# Patient Record
Sex: Female | Born: 1945 | Race: White | Hispanic: No | Smoking: Former smoker
Health system: Southern US, Community
[De-identification: ages and names within clinical notes are randomized; demographics above are authoritative.]

## PROBLEM LIST (undated history)

## (undated) DIAGNOSIS — E039 Hypothyroidism, unspecified: Secondary | ICD-10-CM

## (undated) DIAGNOSIS — J449 Chronic obstructive pulmonary disease, unspecified: Secondary | ICD-10-CM

## (undated) DIAGNOSIS — I10 Essential (primary) hypertension: Secondary | ICD-10-CM

## (undated) DIAGNOSIS — N289 Disorder of kidney and ureter, unspecified: Secondary | ICD-10-CM

## (undated) DIAGNOSIS — C801 Malignant (primary) neoplasm, unspecified: Secondary | ICD-10-CM

## (undated) HISTORY — PX: BACK SURGERY: SHX140

## (undated) HISTORY — PX: HERNIA REPAIR: SHX51

## (undated) HISTORY — PX: TONSILLECTOMY: SUR1361

## (undated) HISTORY — PX: APPENDECTOMY: SHX54

## (undated) HISTORY — PX: OTHER SURGICAL HISTORY: SHX169

## (undated) HISTORY — PX: TUBAL LIGATION: SHX77

---

## 2018-01-18 ENCOUNTER — Inpatient Hospital Stay (HOSPITAL_BASED_OUTPATIENT_CLINIC_OR_DEPARTMENT_OTHER)
Admission: EM | Admit: 2018-01-18 | Discharge: 2018-01-21 | DRG: 871 | Disposition: A | Discharge status: Home / Self Care | Payer: Medicare Other | Attending: Internal Medicine | Admitting: Internal Medicine

## 2018-01-18 ENCOUNTER — Other Ambulatory Visit: Payer: Self-pay

## 2018-01-18 ENCOUNTER — Emergency Department (HOSPITAL_BASED_OUTPATIENT_CLINIC_OR_DEPARTMENT_OTHER): Payer: Medicare Other

## 2018-01-18 ENCOUNTER — Encounter (HOSPITAL_BASED_OUTPATIENT_CLINIC_OR_DEPARTMENT_OTHER): Payer: Self-pay | Admitting: *Deleted

## 2018-01-18 DIAGNOSIS — J189 Pneumonia, unspecified organism: Secondary | ICD-10-CM | POA: Diagnosis not present

## 2018-01-18 DIAGNOSIS — I10 Essential (primary) hypertension: Secondary | ICD-10-CM | POA: Diagnosis not present

## 2018-01-18 DIAGNOSIS — Z888 Allergy status to other drugs, medicaments and biological substances status: Secondary | ICD-10-CM | POA: Diagnosis not present

## 2018-01-18 DIAGNOSIS — A419 Sepsis, unspecified organism: Secondary | ICD-10-CM | POA: Diagnosis present

## 2018-01-18 DIAGNOSIS — E039 Hypothyroidism, unspecified: Secondary | ICD-10-CM | POA: Diagnosis present

## 2018-01-18 DIAGNOSIS — E785 Hyperlipidemia, unspecified: Secondary | ICD-10-CM | POA: Diagnosis not present

## 2018-01-18 DIAGNOSIS — Z7982 Long term (current) use of aspirin: Secondary | ICD-10-CM

## 2018-01-18 DIAGNOSIS — Z79899 Other long term (current) drug therapy: Secondary | ICD-10-CM | POA: Diagnosis not present

## 2018-01-18 DIAGNOSIS — Z905 Acquired absence of kidney: Secondary | ICD-10-CM | POA: Diagnosis not present

## 2018-01-18 DIAGNOSIS — J441 Chronic obstructive pulmonary disease with (acute) exacerbation: Secondary | ICD-10-CM | POA: Diagnosis not present

## 2018-01-18 DIAGNOSIS — J440 Chronic obstructive pulmonary disease with acute lower respiratory infection: Secondary | ICD-10-CM | POA: Diagnosis not present

## 2018-01-18 DIAGNOSIS — Z881 Allergy status to other antibiotic agents status: Secondary | ICD-10-CM | POA: Diagnosis not present

## 2018-01-18 DIAGNOSIS — R059 Cough, unspecified: Secondary | ICD-10-CM

## 2018-01-18 DIAGNOSIS — R05 Cough: Secondary | ICD-10-CM

## 2018-01-18 DIAGNOSIS — Z85528 Personal history of other malignant neoplasm of kidney: Secondary | ICD-10-CM

## 2018-01-18 DIAGNOSIS — R6510 Systemic inflammatory response syndrome (SIRS) of non-infectious origin without acute organ dysfunction: Secondary | ICD-10-CM | POA: Diagnosis present

## 2018-01-18 DIAGNOSIS — J9601 Acute respiratory failure with hypoxia: Secondary | ICD-10-CM | POA: Diagnosis present

## 2018-01-18 DIAGNOSIS — Z87891 Personal history of nicotine dependence: Secondary | ICD-10-CM | POA: Diagnosis not present

## 2018-01-18 HISTORY — DX: Chronic obstructive pulmonary disease, unspecified: J44.9

## 2018-01-18 HISTORY — DX: Hypothyroidism, unspecified: E03.9

## 2018-01-18 HISTORY — DX: Essential (primary) hypertension: I10

## 2018-01-18 HISTORY — DX: Disorder of kidney and ureter, unspecified: N28.9

## 2018-01-18 HISTORY — DX: Malignant (primary) neoplasm, unspecified: C80.1

## 2018-01-18 LAB — CBC WITH DIFFERENTIAL/PLATELET
Abs Immature Granulocytes: 0.12 10*3/uL — ABNORMAL HIGH (ref 0.00–0.07)
Basophils Absolute: 0.1 10*3/uL (ref 0.0–0.1)
Basophils Relative: 0 %
Eosinophils Absolute: 0.1 10*3/uL (ref 0.0–0.5)
Eosinophils Relative: 1 %
HCT: 42.9 % (ref 36.0–46.0)
Hemoglobin: 13.2 g/dL (ref 12.0–15.0)
Immature Granulocytes: 1 %
Lymphocytes Relative: 5 %
Lymphs Abs: 1 10*3/uL (ref 0.7–4.0)
MCH: 24.9 pg — ABNORMAL LOW (ref 26.0–34.0)
MCHC: 30.8 g/dL (ref 30.0–36.0)
MCV: 80.9 fL (ref 80.0–100.0)
Monocytes Absolute: 1.3 10*3/uL — ABNORMAL HIGH (ref 0.1–1.0)
Monocytes Relative: 7 %
NEUTROS PCT: 86 %
Neutro Abs: 15.6 10*3/uL — ABNORMAL HIGH (ref 1.7–7.7)
Platelets: 278 10*3/uL (ref 150–400)
RBC: 5.3 MIL/uL — ABNORMAL HIGH (ref 3.87–5.11)
RDW: 13.9 % (ref 11.5–15.5)
WBC: 18.2 10*3/uL — ABNORMAL HIGH (ref 4.0–10.5)
nRBC: 0 % (ref 0.0–0.2)

## 2018-01-18 LAB — COMPREHENSIVE METABOLIC PANEL
ALBUMIN: 3.8 g/dL (ref 3.5–5.0)
ALT: 25 U/L (ref 0–44)
AST: 26 U/L (ref 15–41)
Alkaline Phosphatase: 65 U/L (ref 38–126)
Anion gap: 9 (ref 5–15)
BUN: 18 mg/dL (ref 8–23)
CO2: 29 mmol/L (ref 22–32)
Calcium: 9.9 mg/dL (ref 8.9–10.3)
Chloride: 99 mmol/L (ref 98–111)
Creatinine, Ser: 1.03 mg/dL — ABNORMAL HIGH (ref 0.44–1.00)
GFR calc Af Amer: >60 mL/min (ref >60)
GFR calc non Af Amer: 55 mL/min — ABNORMAL LOW (ref >60)
Glucose, Bld: 158 mg/dL — ABNORMAL HIGH (ref 70–99)
Potassium: 4.4 mmol/L (ref 3.5–5.1)
Sodium: 137 mmol/L (ref 135–145)
Total Bilirubin: 0.4 mg/dL (ref 0.3–1.2)
Total Protein: 7.1 g/dL (ref 6.5–8.1)

## 2018-01-18 LAB — URINALYSIS, MICROSCOPIC (REFLEX)

## 2018-01-18 LAB — URINALYSIS, ROUTINE W REFLEX MICROSCOPIC
Bilirubin Urine: NEGATIVE
Glucose, UA: NEGATIVE mg/dL
Ketones, ur: NEGATIVE mg/dL
Leukocytes, UA: NEGATIVE
Nitrite: NEGATIVE
Protein, ur: NEGATIVE mg/dL
Specific Gravity, Urine: 1.02 (ref 1.005–1.030)
pH: 8 (ref 5.0–8.0)

## 2018-01-18 LAB — PROTIME-INR
INR: 0.97
Prothrombin Time: 12.8 seconds (ref 11.4–15.2)

## 2018-01-18 LAB — I-STAT CG4 LACTIC ACID, ED: Lactic Acid, Venous: 2 mmol/L (ref 0.5–1.9)

## 2018-01-18 MED ORDER — SODIUM CHLORIDE 0.9 % IV BOLUS
500.0000 mL | Freq: Once | INTRAVENOUS | Status: AC
  Administered 2018-01-18: 500 mL via INTRAVENOUS

## 2018-01-18 MED ORDER — ACETAMINOPHEN 325 MG PO TABS
650.0000 mg | ORAL_TABLET | Freq: Once | ORAL | Status: AC
  Administered 2018-01-18: 650 mg via ORAL
  Filled 2018-01-18: qty 2

## 2018-01-18 MED ORDER — SODIUM CHLORIDE 0.9 % IV SOLN
1.0000 g | Freq: Once | INTRAVENOUS | Status: AC
  Administered 2018-01-18: 1 g via INTRAVENOUS
  Filled 2018-01-18: qty 10

## 2018-01-18 MED ORDER — SODIUM CHLORIDE 0.9 % IV SOLN
INTRAVENOUS | Status: DC | PRN
  Administered 2018-01-18: 500 mL via INTRAVENOUS

## 2018-01-18 MED ORDER — ALBUTEROL SULFATE (2.5 MG/3ML) 0.083% IN NEBU
5.0000 mg | INHALATION_SOLUTION | Freq: Once | RESPIRATORY_TRACT | Status: AC
  Administered 2018-01-18: 5 mg via RESPIRATORY_TRACT
  Filled 2018-01-18: qty 6

## 2018-01-18 NOTE — Progress Notes (Signed)
Patient's lactic acid is 2.00 mmol/L.  Dr Ralene Bathe notified of results.

## 2018-01-18 NOTE — ED Notes (Signed)
EDP came out of pt's room and stated that the patient was taken her own ibuprofen 400 mg at this time.

## 2018-01-18 NOTE — ED Notes (Signed)
Pt. Reports she came to ED due to fever chills and breathing being off and back pain.

## 2018-01-18 NOTE — ED Provider Notes (Signed)
[facility] EMERGENCY DEPARTMENT Provider Note   CSN: [id number] Arrival date & time: 01/18/18  2006     History   Chief Complaint Chief Complaint  Patient presents with  . Fever  . Shortness of Breath    HPI Rhonda Hancock is a 72 y.o. female.  The history is provided by the patient. No language interpreter was used.  Fever    Shortness of Breath  Associated symptoms include a fever.   Rhonda Hancock is a 72 y.o. female who presents to the Emergency Department complaining of fever. Presents to the emergency department complaining of fever and chills that began about 4 o'clock this afternoon. She was feeling well prior to the symptoms beginning. She then developed nausea and shaking chills. Later in the evening she developed fevers and she presents for evaluation. She does feel slightly short of breath without any significant cough. No abdominal pain. She has experienced urinary frequency over the last few days. No dysuria. She has a history of COPD as well as partial nephrectomy for renal cell carcinoma. She is currently visiting from [place]. Past Medical History:  Diagnosis Date  . Cancer ([place])   . COPD (chronic obstructive pulmonary disease) ([place])   . Hypertension   . Renal disorder     Patient Active Problem List   Diagnosis Date Noted  . SIRS (systemic inflammatory response syndrome) ([place]) 01/18/2018    Past Surgical History:  Procedure Laterality Date  . BACK SURGERY    . HERNIA REPAIR    . kidney cancer    . TONSILLECTOMY    . TUBAL LIGATION       OB History   None      Home Medications    Prior to Admission medications   Medication Sig Start Date End Date Taking? Authorizing Provider  Levothyroxine Sodium (SYNTHROID PO) Take by mouth.   Yes [provider]  metoprolol succinate (TOPROL-XL) 25 MG 24 hr tablet Take 25 mg by mouth daily.   Yes [provider]  omeprazole (PRILOSEC) 40 MG capsule Take 40 mg by mouth daily.    Yes [provider]  SIMVASTATIN PO Take by mouth.   Yes [provider]  Umeclidinium-Vilanterol (ANORO ELLIPTA IN) Inhale into the lungs.   Yes [provider]    Family History No family history on file.  Social History Social History   Tobacco Use  . Smoking status: Former Research scientist (life sciences)  . Smokeless tobacco: Never Used  Substance Use Topics  . Alcohol use: Yes  . Drug use: Never     Allergies   Vibramycin [doxycycline calcium]   Review of Systems Review of Systems  Constitutional: Positive for fever.  Respiratory: Positive for shortness of breath.   All other systems reviewed and are negative.    Physical Exam Updated Vital Signs BP (!) 120/54   Pulse (!) 121   Temp (!) 103.1 F (39.5 C) (Oral)   Resp 19   Ht 5' (1.524 m)   Wt 74.8 kg   SpO2 94%   BMI 32.22 kg/m   Physical Exam  Constitutional: She is oriented to person, place, and time. She appears well-developed and well-nourished.  HENT:  Head: Normocephalic and atraumatic.  Cardiovascular: Regular rhythm.  Tachycardic. Systolic ejection murmur.  Pulmonary/Chest: No respiratory distress.  Tachypnea. Decreased air movement bilaterally  Abdominal: Soft. There is no tenderness. There is no rebound and no guarding.  Musculoskeletal: She exhibits no tenderness.  Trace edema to BLE  Neurological:  She is alert and oriented to person, place, and time.  Skin: Skin is warm and dry.  Psychiatric: She has a normal mood and affect. Her behavior is normal.  Nursing note and vitals reviewed.    ED Treatments / Results  Labs (all labs ordered are listed, but only abnormal results are displayed) Labs Reviewed  COMPREHENSIVE METABOLIC PANEL - Abnormal; Notable for the following components:      Result Value   Glucose, Bld 158 (*)    Creatinine, Ser 1.03 (*)    GFR calc non Af Amer 55 (*)    All other components within normal limits  CBC WITH DIFFERENTIAL/PLATELET - Abnormal; Notable  for the following components:   WBC 18.2 (*)    RBC 5.30 (*)    MCH 24.9 (*)    Neutro Abs 15.6 (*)    Monocytes Absolute 1.3 (*)    Abs Immature Granulocytes 0.12 (*)    All other components within normal limits  URINALYSIS, ROUTINE W REFLEX MICROSCOPIC - Abnormal; Notable for the following components:   Hgb urine dipstick TRACE (*)    All other components within normal limits  URINALYSIS, MICROSCOPIC (REFLEX) - Abnormal; Notable for the following components:   Bacteria, UA FEW (*)    All other components within normal limits  I-STAT CG4 LACTIC ACID, ED - Abnormal; Notable for the following components:   Lactic Acid, Venous 2.00 (*)    All other components within normal limits  CULTURE, BLOOD (ROUTINE X 2)  CULTURE, BLOOD (ROUTINE X 2)  PROTIME-INR  I-STAT CG4 LACTIC ACID, ED    EKG None  Radiology Dg Chest 2 View  Result Date: 01/18/2018 CLINICAL DATA:  Fever and chills with dyspnea since 4 p.m. EXAM: CHEST - 2 VIEW COMPARISON:  None. FINDINGS: Top-normal heart size with mild-to-moderate aortic atherosclerosis. Loop recording device projects over the left anterior chest wall. Slight hyperinflation of the lungs, upper lobe predominant with crowding of lower lobe interstitial lung markings. No alveolar consolidation, effusion or pneumothorax. 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 Aortic atherosclerosis. No active pulmonary disease. Electronically Signed   By: Ashley Royalty M.D.   On: 01/18/2018 21:27    Procedures Procedures (including critical care time)  Medications Ordered in ED Medications  cefTRIAXone (ROCEPHIN) 1 g in sodium chloride 0.9 % 100 mL IVPB (1 g Intravenous New Bag/Given 01/18/18 2333)  0.9 %  sodium chloride infusion (500 mLs Intravenous New Bag/Given 01/18/18 2330)  acetaminophen (TYLENOL) tablet 650 mg (650 mg Oral Given 01/18/18 2045)  sodium chloride 0.9 % bolus  500 mL (0 mLs Intravenous Stopped 01/18/18 2233)  albuterol (PROVENTIL) (2.5 MG/3ML) 0.083% nebulizer solution 5 mg (5 mg Nebulization Given 01/18/18 2134)     Initial Impression / Assessment and Plan / ED Course  I have reviewed the triage vital signs and the nursing notes.  Pertinent labs & imaging results that were available during my care of the patient were reviewed by me and considered in my medical decision making (see chart for details).     Here for evaluation of sudden onset fever, chills, shortness of breath with several days of urinary frequency. She is ill appearing on evaluation with tachypnea, decreased air movement. She is treated with IV fluids, butyl neb. There is improvement in her air movement following albuterol treatment. Chest x-ray without evidence of pneumonia. Antibiotics ordered for possible UTI pending urinalysis. UA is not consistent with UTI. Patient  initially declined antibiotics due to probiotics not being available in this facility. Discussed with patient concern for possible bacterial infection and recommendation for antibiotics. Patient eventually agreeable to antibiotics. Also possibility for influenza. Flu testing not available at this time. Given new oxygen requirement, search criteria recommend admission to the hospital. Plan to transfer to [facility] for further treatment and additional testing. Discussed with patient findings of studies and recommendation for admission and she is in agreement with treatment plan. Hospitalist consulted for admission.  Final Clinical Impressions(s) / ED Diagnoses   Final diagnoses:  None    ED Discharge Orders    None       Quintella Reichert, MD 01/18/18 2338

## 2018-01-18 NOTE — ED Triage Notes (Addendum)
Fever, chills, SOB today. Back pain. Urinary frequency. She took Advil at 4pm.

## 2018-01-18 NOTE — ED Notes (Signed)
She was placed on oxygen at triage while tx room is being cleaned.

## 2018-01-18 NOTE — ED Notes (Signed)
Family at bedside. 

## 2018-01-18 NOTE — Progress Notes (Signed)
Placed patient on 3 liter nasal cannula to patient's SPO2 of 86 and WOB.  Patient's SPO2 increased to 95% and WOB has decreased.

## 2018-01-19 ENCOUNTER — Encounter (HOSPITAL_COMMUNITY): Payer: Self-pay | Admitting: Internal Medicine

## 2018-01-19 DIAGNOSIS — Z79899 Other long term (current) drug therapy: Secondary | ICD-10-CM | POA: Diagnosis not present

## 2018-01-19 DIAGNOSIS — Z87891 Personal history of nicotine dependence: Secondary | ICD-10-CM | POA: Diagnosis not present

## 2018-01-19 DIAGNOSIS — A419 Sepsis, unspecified organism: Secondary | ICD-10-CM | POA: Diagnosis present

## 2018-01-19 DIAGNOSIS — E039 Hypothyroidism, unspecified: Secondary | ICD-10-CM | POA: Diagnosis present

## 2018-01-19 DIAGNOSIS — I10 Essential (primary) hypertension: Secondary | ICD-10-CM | POA: Diagnosis present

## 2018-01-19 DIAGNOSIS — J189 Pneumonia, unspecified organism: Secondary | ICD-10-CM | POA: Diagnosis present

## 2018-01-19 DIAGNOSIS — E785 Hyperlipidemia, unspecified: Secondary | ICD-10-CM | POA: Diagnosis present

## 2018-01-19 DIAGNOSIS — Z881 Allergy status to other antibiotic agents status: Secondary | ICD-10-CM | POA: Diagnosis not present

## 2018-01-19 DIAGNOSIS — J9601 Acute respiratory failure with hypoxia: Secondary | ICD-10-CM | POA: Diagnosis present

## 2018-01-19 DIAGNOSIS — Z905 Acquired absence of kidney: Secondary | ICD-10-CM | POA: Diagnosis not present

## 2018-01-19 DIAGNOSIS — J441 Chronic obstructive pulmonary disease with (acute) exacerbation: Secondary | ICD-10-CM | POA: Diagnosis present

## 2018-01-19 DIAGNOSIS — Z888 Allergy status to other drugs, medicaments and biological substances status: Secondary | ICD-10-CM | POA: Diagnosis not present

## 2018-01-19 DIAGNOSIS — R6510 Systemic inflammatory response syndrome (SIRS) of non-infectious origin without acute organ dysfunction: Secondary | ICD-10-CM | POA: Diagnosis not present

## 2018-01-19 DIAGNOSIS — Z85528 Personal history of other malignant neoplasm of kidney: Secondary | ICD-10-CM | POA: Diagnosis not present

## 2018-01-19 DIAGNOSIS — Z7982 Long term (current) use of aspirin: Secondary | ICD-10-CM | POA: Diagnosis not present

## 2018-01-19 DIAGNOSIS — J440 Chronic obstructive pulmonary disease with acute lower respiratory infection: Secondary | ICD-10-CM | POA: Diagnosis present

## 2018-01-19 LAB — CBC WITH DIFFERENTIAL/PLATELET
Abs Immature Granulocytes: 0.3 10*3/uL — ABNORMAL HIGH (ref 0.00–0.07)
Basophils Absolute: 0.1 10*3/uL (ref 0.0–0.1)
Basophils Relative: 0 %
Eosinophils Absolute: 0 10*3/uL (ref 0.0–0.5)
Eosinophils Relative: 0 %
HCT: 39.5 % (ref 36.0–46.0)
HEMOGLOBIN: 12.1 g/dL (ref 12.0–15.0)
Immature Granulocytes: 1 %
Lymphocytes Relative: 5 %
Lymphs Abs: 1.2 10*3/uL (ref 0.7–4.0)
MCH: 25.5 pg — AB (ref 26.0–34.0)
MCHC: 30.6 g/dL (ref 30.0–36.0)
MCV: 83.2 fL (ref 80.0–100.0)
Monocytes Absolute: 2.2 10*3/uL — ABNORMAL HIGH (ref 0.1–1.0)
Monocytes Relative: 9 %
Neutro Abs: 20 10*3/uL — ABNORMAL HIGH (ref 1.7–7.7)
Neutrophils Relative %: 85 %
Platelets: 236 10*3/uL (ref 150–400)
RBC: 4.75 MIL/uL (ref 3.87–5.11)
RDW: 14.2 % (ref 11.5–15.5)
WBC: 23.8 10*3/uL — ABNORMAL HIGH (ref 4.0–10.5)
nRBC: 0 % (ref 0.0–0.2)

## 2018-01-19 LAB — INFLUENZA PANEL BY PCR (TYPE A & B)
Influenza A By PCR: NEGATIVE
Influenza B By PCR: NEGATIVE

## 2018-01-19 LAB — HEPATIC FUNCTION PANEL
ALT: 23 U/L (ref 0–44)
AST: 27 U/L (ref 15–41)
Albumin: 3.5 g/dL (ref 3.5–5.0)
Alkaline Phosphatase: 51 U/L (ref 38–126)
BILIRUBIN DIRECT: 0.1 mg/dL (ref 0.0–0.2)
Indirect Bilirubin: 0.5 mg/dL (ref 0.3–0.9)
Total Bilirubin: 0.6 mg/dL (ref 0.3–1.2)
Total Protein: 6.2 g/dL — ABNORMAL LOW (ref 6.5–8.1)

## 2018-01-19 LAB — RESPIRATORY PANEL BY PCR
ADENOVIRUS-RVPPCR: NOT DETECTED
Bordetella pertussis: NOT DETECTED
CHLAMYDOPHILA PNEUMONIAE-RVPPCR: NOT DETECTED
CORONAVIRUS NL63-RVPPCR: NOT DETECTED
Coronavirus 229E: NOT DETECTED
Coronavirus HKU1: NOT DETECTED
Coronavirus OC43: NOT DETECTED
Influenza A: NOT DETECTED
Influenza B: NOT DETECTED
Metapneumovirus: NOT DETECTED
Mycoplasma pneumoniae: NOT DETECTED
Parainfluenza Virus 1: NOT DETECTED
Parainfluenza Virus 2: NOT DETECTED
Parainfluenza Virus 3: NOT DETECTED
Parainfluenza Virus 4: NOT DETECTED
Respiratory Syncytial Virus: NOT DETECTED
Rhinovirus / Enterovirus: NOT DETECTED

## 2018-01-19 LAB — BASIC METABOLIC PANEL
Anion gap: 9 (ref 5–15)
BUN: 19 mg/dL (ref 8–23)
CO2: 26 mmol/L (ref 22–32)
Calcium: 9.1 mg/dL (ref 8.9–10.3)
Chloride: 105 mmol/L (ref 98–111)
Creatinine, Ser: 1.03 mg/dL — ABNORMAL HIGH (ref 0.44–1.00)
GFR calc Af Amer: >60 mL/min (ref >60)
GFR calc non Af Amer: 55 mL/min — ABNORMAL LOW (ref >60)
Glucose, Bld: 123 mg/dL — ABNORMAL HIGH (ref 70–99)
Potassium: 3.8 mmol/L (ref 3.5–5.1)
Sodium: 140 mmol/L (ref 135–145)

## 2018-01-19 LAB — TROPONIN I
TROPONIN I: 0.03 ng/mL — AB (ref <0.03)
Troponin I: 0.03 ng/mL (ref <0.03)

## 2018-01-19 LAB — LACTIC ACID, PLASMA
Lactic Acid, Venous: 2 mmol/L (ref 0.5–1.9)
Lactic Acid, Venous: 1.8 mmol/L (ref 0.5–1.9)

## 2018-01-19 LAB — PROCALCITONIN: Procalcitonin: 0.2 ng/mL

## 2018-01-19 MED ORDER — ONDANSETRON HCL 4 MG PO TABS: 4.0000 mg | ORAL_TABLET | Freq: Four times a day (QID) | ORAL | Status: DC | PRN

## 2018-01-19 MED ORDER — ASPIRIN EC 81 MG PO TBEC
81.0000 mg | DELAYED_RELEASE_TABLET | Freq: Every day | ORAL | Status: DC
  Administered 2018-01-19 – 2018-01-21 (×3): 81 mg via ORAL
  Filled 2018-01-19 (×3): qty 1

## 2018-01-19 MED ORDER — METOPROLOL SUCCINATE ER 25 MG PO TB24
25.0000 mg | ORAL_TABLET | Freq: Every day | ORAL | Status: DC
  Administered 2018-01-19 – 2018-01-20 (×3): 25 mg via ORAL
  Filled 2018-01-19 (×3): qty 1

## 2018-01-19 MED ORDER — OMEGA-3-ACID ETHYL ESTERS 1 G PO CAPS
1000.0000 mg | ORAL_CAPSULE | Freq: Every day | ORAL | Status: DC
  Administered 2018-01-19 – 2018-01-21 (×3): 1000 mg via ORAL
  Filled 2018-01-19 (×3): qty 1

## 2018-01-19 MED ORDER — SACCHAROMYCES BOULARDII 250 MG PO CAPS
250.0000 mg | ORAL_CAPSULE | Freq: Two times a day (BID) | ORAL | Status: DC
  Administered 2018-01-19 – 2018-01-21 (×6): 250 mg via ORAL
  Filled 2018-01-19 (×6): qty 1

## 2018-01-19 MED ORDER — SODIUM CHLORIDE 0.9 % IV BOLUS
500.0000 mL | Freq: Once | INTRAVENOUS | Status: AC
  Administered 2018-01-19: 500 mL via INTRAVENOUS

## 2018-01-19 MED ORDER — ACETAMINOPHEN 325 MG PO TABS
650.0000 mg | ORAL_TABLET | Freq: Four times a day (QID) | ORAL | Status: DC | PRN
  Administered 2018-01-20: 650 mg via ORAL
  Filled 2018-01-19: qty 2

## 2018-01-19 MED ORDER — TIZANIDINE HCL 4 MG PO TABS: 4.0000 mg | ORAL_TABLET | Freq: Every evening | ORAL | Status: DC | PRN

## 2018-01-19 MED ORDER — ORAL CARE MOUTH RINSE
15.0000 mL | Freq: Two times a day (BID) | OROMUCOSAL | Status: DC
  Administered 2018-01-19 – 2018-01-20 (×4): 15 mL via OROMUCOSAL

## 2018-01-19 MED ORDER — ACETAMINOPHEN 650 MG RE SUPP: 650.0000 mg | Freq: Four times a day (QID) | RECTAL | Status: DC | PRN

## 2018-01-19 MED ORDER — LEVALBUTEROL HCL 0.63 MG/3ML IN NEBU
0.6300 mg | INHALATION_SOLUTION | Freq: Three times a day (TID) | RESPIRATORY_TRACT | Status: DC
  Administered 2018-01-19 – 2018-01-20 (×5): 0.63 mg via RESPIRATORY_TRACT
  Filled 2018-01-19 (×5): qty 3

## 2018-01-19 MED ORDER — LEVALBUTEROL HCL 0.63 MG/3ML IN NEBU
0.6300 mg | INHALATION_SOLUTION | Freq: Four times a day (QID) | RESPIRATORY_TRACT | Status: DC | PRN
  Administered 2018-01-19: 0.63 mg via RESPIRATORY_TRACT
  Filled 2018-01-19: qty 3

## 2018-01-19 MED ORDER — PANTOPRAZOLE SODIUM 40 MG PO TBEC
40.0000 mg | DELAYED_RELEASE_TABLET | Freq: Every day | ORAL | Status: DC
  Administered 2018-01-19 – 2018-01-21 (×3): 40 mg via ORAL
  Filled 2018-01-19 (×3): qty 1

## 2018-01-19 MED ORDER — ONDANSETRON HCL 4 MG/2ML IJ SOLN: 4.0000 mg | Freq: Four times a day (QID) | INTRAMUSCULAR | Status: DC | PRN

## 2018-01-19 MED ORDER — LEVOTHYROXINE SODIUM 88 MCG PO TABS
88.0000 ug | ORAL_TABLET | Freq: Every day | ORAL | Status: DC
  Administered 2018-01-19 – 2018-01-21 (×3): 88 ug via ORAL
  Filled 2018-01-19 (×3): qty 1

## 2018-01-19 MED ORDER — ENOXAPARIN SODIUM 40 MG/0.4ML ~~LOC~~ SOLN
40.0000 mg | SUBCUTANEOUS | Status: DC
  Administered 2018-01-19: 40 mg via SUBCUTANEOUS
  Filled 2018-01-19: qty 0.4

## 2018-01-19 MED ORDER — SIMVASTATIN 20 MG PO TABS
20.0000 mg | ORAL_TABLET | Freq: Every day | ORAL | Status: DC
  Administered 2018-01-19 – 2018-01-20 (×2): 20 mg via ORAL
  Filled 2018-01-19 (×2): qty 1

## 2018-01-19 MED ORDER — SODIUM CHLORIDE 0.9 % IV SOLN
500.0000 mg | Freq: Every day | INTRAVENOUS | Status: DC
  Administered 2018-01-19 – 2018-01-20 (×2): 500 mg via INTRAVENOUS
  Filled 2018-01-19 (×2): qty 500

## 2018-01-19 MED ORDER — CALCIUM CARBONATE-VITAMIN D 500-200 MG-UNIT PO TABS
2.0000 | ORAL_TABLET | Freq: Every day | ORAL | Status: DC
  Administered 2018-01-19 – 2018-01-21 (×3): 2 via ORAL
  Filled 2018-01-19 (×3): qty 2

## 2018-01-19 MED ORDER — SODIUM CHLORIDE 0.9 % IV SOLN
INTRAVENOUS | Status: DC
  Administered 2018-01-19 (×3): via INTRAVENOUS

## 2018-01-19 MED ORDER — UMECLIDINIUM-VILANTEROL 62.5-25 MCG/INH IN AEPB
1.0000 | INHALATION_SPRAY | Freq: Every day | RESPIRATORY_TRACT | Status: DC
  Administered 2018-01-19 – 2018-01-21 (×3): 1 via RESPIRATORY_TRACT
  Filled 2018-01-19: qty 14

## 2018-01-19 MED ORDER — SODIUM CHLORIDE 0.9 % IV SOLN
1.0000 g | INTRAVENOUS | Status: DC
  Administered 2018-01-19 – 2018-01-20 (×2): 1 g via INTRAVENOUS
  Filled 2018-01-19: qty 1
  Filled 2018-01-19: qty 10
  Filled 2018-01-19: qty 1

## 2018-01-19 NOTE — H&P (Signed)
History and Physical    Ovella Manygoats LZJ:[id number] DOB: September 19, 1945 DOA: 01/18/2018  PCP: System, Pcp Not In  Patient coming from: Home.  Chief Complaint: Fever and chills.  HPI: Rhonda Hancock is a 72 y.o. female with history of renal cell carcinoma status post nephrectomy, COPD, hypertension, hyperlipidemia, hypothyroidism who is visiting [place] from [place] started developing sudden onset of fever chills at around 4 PM.  At that time patient also developed some shortness of breath with productive cough.  Denies any nausea vomiting abdominal pain diarrhea or chest pain.  Since symptoms persisted patient came to the ER at [facility].  ED Course: In the ER patient was found to be tachycardic with fever 103 F labs are showing leukocytosis elevated lactate with picture of SIRS with no clear source.  Chest x-ray was unremarkable UA did not show any definite signs of UTI.  Patient was empirically started on antibiotics after getting blood cultures.  And on arrival at the floor influenza PCR was ordered.  Which is pending.  Review of Systems: As per HPI, rest all negative.   Past Medical History:  Diagnosis Date  . Cancer ([place])   . COPD (chronic obstructive pulmonary disease) ([place])   . Hypertension   . Hypothyroidism   . Renal disorder     Past Surgical History:  Procedure Laterality Date  . APPENDECTOMY    . BACK SURGERY    . HERNIA REPAIR    . kidney cancer    . TONSILLECTOMY    . TUBAL LIGATION       reports that she has quit smoking. She has never used smokeless tobacco. She reports that she drinks alcohol. She reports that she does not use drugs.  Allergies  Allergen Reactions  . Vibramycin [Doxycycline Calcium]     hives  . Meperidine Nausea And Vomiting    Family History  Problem Relation Age of Onset  . Stroke Mother   . CAD Father   . Breast cancer Daughter     Prior to Admission medications   Medication Sig Start Date End Date Taking?  Authorizing Provider  ANORO ELLIPTA 62.5-25 MCG/INH AEPB Inhale 1 puff into the lungs daily. 01/06/18  Yes [provider]  aspirin EC 81 MG tablet Take 81 mg by mouth daily.   Yes [provider]  calcium-vitamin D (OSCAL WITH D) 250-125 MG-UNIT tablet Take 2 tablets by mouth daily.   Yes [provider]  ibuprofen (ADVIL,MOTRIN) 200 MG tablet Take 400 mg by mouth every 6 (six) hours as needed for headache, mild pain or moderate pain.   Yes [provider]  levothyroxine (SYNTHROID, LEVOTHROID) 88 MCG tablet Take 88 mcg by mouth daily before breakfast. 10/30/17  Yes [provider]  metoprolol succinate (TOPROL-XL) 25 MG 24 hr tablet Take 25 mg by mouth at bedtime.    Yes [provider]  Omega-3 Fatty Acids (FISH OIL) 1000 MG CAPS Take 1,000 mg by mouth daily.   Yes [provider]  omeprazole (PRILOSEC) 40 MG capsule Take 40 mg by mouth every other day.    Yes [provider]  simvastatin (ZOCOR) 20 MG tablet Take 20 mg by mouth at bedtime. 01/06/18  Yes [provider]  tiZANidine (ZANAFLEX) 4 MG tablet Take 4 mg by mouth at bedtime as needed for muscle spasms.  12/29/17  Yes [provider]    Physical Exam: Vitals:   01/18/18 2200 01/18/18 2330 01/19/18 0045 01/19/18 0045  BP: (!) 142/78 (!) 120/54  127/64  Pulse: (!) 108 (!) 121  (!) 105  Resp: 16 19  20   Temp:  (!) 102.9 F (39.4 C)  98.2 F (36.8 C)  TempSrc:  Oral  Oral  SpO2: 99% 94%  93%  Weight:   84.1 kg   Height:   5' (1.524 m)       Constitutional: Moderately built and nourished. Vitals:   01/18/18 2200 01/18/18 2330 01/19/18 0045 01/19/18 0045  BP: (!) 142/78 (!) 120/54  127/64  Pulse: (!) 108 (!) 121  (!) 105  Resp: 16 19  20   Temp:  (!) 102.9 F (39.4 C)  98.2 F (36.8 C)  TempSrc:  Oral  Oral  SpO2: 99% 94%  93%  Weight:   84.1 kg   Height:   5' (1.524 m)    Eyes: Anicteric no pallor. ENMT: No discharge from the  ears eyes nose or mouth. Neck: No mass or.  No neck rigidity. Respiratory: Bilateral expiratory wheeze and no crepitations. Cardiovascular: S1-S2 heard no murmurs appreciated. Abdomen: Soft nontender bowel sounds present. Musculoskeletal: No edema.  No joint effusion. Skin: No rash. Neurologic: Alert awake oriented to time place and person.  Moves all extremities. Psychiatric: Appears normal.  Normal affect.   Labs on Admission: I have personally reviewed following labs and imaging studies  CBC: Recent Labs  Lab 01/18/18 2015  WBC 18.2*  NEUTROABS 15.6*  HGB 13.2  HCT 42.9  MCV 80.9  PLT 761   Basic Metabolic Panel: Recent Labs  Lab 01/18/18 2015  NA 137  K 4.4  CL 99  CO2 29  GLUCOSE 158*  BUN 18  CREATININE 1.03*  CALCIUM 9.9   GFR: Estimated Creatinine Clearance: 48.2 mL/min (A) (by C-G formula based on SCr of 1.03 mg/dL (H)). Liver Function Tests: Recent Labs  Lab 01/18/18 2015  AST 26  ALT 25  ALKPHOS 65  BILITOT 0.4  PROT 7.1  ALBUMIN 3.8   No results for input(s): LIPASE, AMYLASE in the last 168 hours. No results for input(s): AMMONIA in the last 168 hours. Coagulation Profile: Recent Labs  Lab 01/18/18 2015  INR 0.97   Cardiac Enzymes: No results for input(s): CKTOTAL, CKMB, CKMBINDEX, TROPONINI in the last 168 hours. BNP (last 3 results) No results for input(s): PROBNP in the last 8760 hours. HbA1C: No results for input(s): HGBA1C in the last 72 hours. CBG: No results for input(s): GLUCAP in the last 168 hours. Lipid Profile: No results for input(s): CHOL, HDL, LDLCALC, TRIG, CHOLHDL, LDLDIRECT in the last 72 hours. Thyroid Function Tests: No results for input(s): TSH, T4TOTAL, FREET4, T3FREE, THYROIDAB in the last 72 hours. Anemia Panel: No results for input(s): VITAMINB12, FOLATE, FERRITIN, TIBC, IRON, RETICCTPCT in the last 72 hours. Urine analysis:    Component Value Date/Time   COLORURINE YELLOW 01/18/2018 2124   APPEARANCEUR  CLEAR 01/18/2018 2124   LABSPEC 1.020 01/18/2018 2124   PHURINE 8.0 01/18/2018 2124   GLUCOSEU NEGATIVE 01/18/2018 2124   HGBUR TRACE (A) 01/18/2018 2124   BILIRUBINUR NEGATIVE 01/18/2018 2124   [place] NEGATIVE 01/18/2018 2124   PROTEINUR NEGATIVE 01/18/2018 2124   NITRITE NEGATIVE 01/18/2018 2124   LEUKOCYTESUR NEGATIVE 01/18/2018 2124   Sepsis Labs: @LABRCNTIP (procalcitonin:4,lacticidven:4) )No results found for this or any previous visit (from the past 240 hour(s)).   Radiological Exams on Admission: Dg Chest 2 View  Result Date: 01/18/2018 CLINICAL DATA:  Fever and chills with dyspnea since 4 p.m. EXAM: CHEST -  2 VIEW COMPARISON:  None. FINDINGS: Top-normal heart size with mild-to-moderate aortic atherosclerosis. Loop recording device projects over the left anterior chest wall. Slight hyperinflation of the lungs, upper lobe predominant with crowding of lower lobe interstitial lung markings. No alveolar consolidation, effusion or pneumothorax. 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 Aortic atherosclerosis. No active pulmonary disease. Electronically Signed   By: Ashley Royalty M.D.   On: 01/18/2018 21:27     Assessment/Plan Principal Problem:   SIRS (systemic inflammatory response syndrome) ([facility]) Active Problems:   COPD with acute exacerbation ([facility])   Essential hypertension   Hypothyroidism   HLD (hyperlipidemia)    1. SIRS source not clear given that patient has some shortness of breath or productive cough and on exam patient is wheezing patient is empirically started on antibiotics for pneumonia.  Follow blood cultures urine cultures lactate levels procalcitonin levels check influenza PCR and continue hydration. 2. COPD with mild exacerbation on inhalers.  If there is any further worsening of wheezing may need IV steroids. 3. Hypertension on metoprolol. 4. Hyperlipidemia on  statins. 5. Hypothyroidism on Synthroid. 6. History of renal cell carcinoma status post nephrectomy.   DVT prophylaxis: Lovenox. Code Status: Full code. Family Communication: Discussed with patient. Disposition Plan: Home. Consults called: None. Admission status: Observation.   Rise Patience MD [facility] Pager [phone].  If 7PM-7AM, please contact night-coverage [email or web address] Password [email or web address]  01/19/2018, 1:44 AM

## 2018-01-19 NOTE — Progress Notes (Signed)
Patient seen and examined at bedside, patient admitted after midnight, please see earlier detailed admission note by Rise Patience, MD. Briefly, patient presented with malaise, fever and chills found to meet criteria for SIRS. She has been started on antibiotics. Urine culture added (post-antibiotics). Blood cultures pending. RVP negative. Continue current antibiotic regimen. Blood pressure normal and patient having some mild edema. Will discontinue IV fluids. On exam, patient with a murmur which is chronic. Last Transthoracic Echocardiogram from 2012 in care everywhere with a normal EF.   Cordelia Poche, MD [facility] 01/19/2018, 1:45 PM

## 2018-01-19 NOTE — Plan of Care (Signed)
  Problem: Health Behavior/Discharge Planning: Goal: Ability to manage health-related needs will improve Outcome: Progressing   Problem: Clinical Measurements: Goal: Ability to maintain clinical measurements within normal limits will improve Outcome: Progressing Goal: Will remain free from infection Outcome: Progressing Goal: Diagnostic test results will improve Outcome: Progressing Goal: Respiratory complications will improve Outcome: Progressing Goal: Cardiovascular complication will be avoided Outcome: Progressing   Problem: Nutrition: Goal: Adequate nutrition will be maintained Outcome: Progressing   Problem: Elimination: Goal: Will not experience complications related to bowel motility Outcome: Progressing Goal: Will not experience complications related to urinary retention Outcome: Progressing   Problem: Safety: Goal: Ability to remain free from injury will improve Outcome: Progressing   Problem: Skin Integrity: Goal: Risk for impaired skin integrity will decrease Outcome: Progressing   

## 2018-01-19 NOTE — Progress Notes (Signed)
CRITICAL VALUE ALERT  Critical Value:  Lactic acid 2.0, troponin 0.03  Date & Time Notied:  01/19/18 0340  Provider Notified: Frederik Pear  Orders Received/Actions taken: No new orders at this time. Will continue to monitor.

## 2018-01-20 ENCOUNTER — Inpatient Hospital Stay (HOSPITAL_COMMUNITY): Payer: Medicare Other

## 2018-01-20 LAB — BASIC METABOLIC PANEL
Anion gap: 8 (ref 5–15)
BUN: 10 mg/dL (ref 8–23)
CO2: 25 mmol/L (ref 22–32)
Calcium: 9 mg/dL (ref 8.9–10.3)
Chloride: 104 mmol/L (ref 98–111)
Creatinine, Ser: 0.71 mg/dL (ref 0.44–1.00)
GFR calc Af Amer: >60 mL/min (ref >60)
GFR calc non Af Amer: >60 mL/min (ref >60)
Glucose, Bld: 111 mg/dL — ABNORMAL HIGH (ref 70–99)
POTASSIUM: 4 mmol/L (ref 3.5–5.1)
Sodium: 137 mmol/L (ref 135–145)

## 2018-01-20 LAB — URINE CULTURE: Culture: <10000 — AB

## 2018-01-20 LAB — CBC
HCT: 40.6 % (ref 36.0–46.0)
Hemoglobin: 12.1 g/dL (ref 12.0–15.0)
MCH: 25.5 pg — ABNORMAL LOW (ref 26.0–34.0)
MCHC: 29.8 g/dL — ABNORMAL LOW (ref 30.0–36.0)
MCV: 85.5 fL (ref 80.0–100.0)
Platelets: 254 10*3/uL (ref 150–400)
RBC: 4.75 MIL/uL (ref 3.87–5.11)
RDW: 14.6 % (ref 11.5–15.5)
WBC: 19.9 10*3/uL — ABNORMAL HIGH (ref 4.0–10.5)
nRBC: 0 % (ref 0.0–0.2)

## 2018-01-20 MED ORDER — AZITHROMYCIN 250 MG PO TABS
500.0000 mg | ORAL_TABLET | Freq: Every day | ORAL | Status: DC
  Administered 2018-01-21: 500 mg via ORAL
  Filled 2018-01-20: qty 2

## 2018-01-20 MED ORDER — LEVALBUTEROL HCL 0.63 MG/3ML IN NEBU
0.6300 mg | INHALATION_SOLUTION | Freq: Two times a day (BID) | RESPIRATORY_TRACT | Status: DC
  Administered 2018-01-21: 0.63 mg via RESPIRATORY_TRACT
  Filled 2018-01-20: qty 3

## 2018-01-20 MED ORDER — ENOXAPARIN SODIUM 40 MG/0.4ML ~~LOC~~ SOLN
40.0000 mg | SUBCUTANEOUS | Status: DC
  Administered 2018-01-20: 40 mg via SUBCUTANEOUS
  Filled 2018-01-20: qty 0.4

## 2018-01-20 NOTE — Progress Notes (Signed)
PROGRESS NOTE    Rhonda Hancock  YQM:[id number] DOB: September 26, 1945 DOA: 01/18/2018 PCP: System, Pcp Not In    Brief Narrative: 72 year old with past medical history significant for  renal cell carcinoma, status post nephrectomy, COPD, who is visiting [place] from [place], she developed sudden onset of fevers chills.  She noticed some mild productive cough. She was found to have a temperature of 103, significant leukocytosis.  Initial chest x-ray was unremarkable.  Influenza PCR  for negative.    Assessment & Plan:   Principal Problem:   SIRS (systemic inflammatory response syndrome) (HCC) Active Problems:   COPD with acute exacerbation (HCC)   Essential hypertension   Hypothyroidism   HLD (hyperlipidemia)   1-left upper lobe pneumonia; Patient presented with cough, fever initial chest x-ray was negative.  Repeated chest x-ray showed left upper lobe pneumonia. Patient with significant cytosis.  For this reason we will continue with IV antibiotics for 24 hours more.  2-sepsis; patient present with leukocytosis, mild increased creatinine, fever, tachycardia, hypoxemia. Repeated chest x-ray confirmed pneumonia. Received IV antibiotics, IV fluids.  3 acute hypoxic respiratory failure. Oxygen saturation 86 on room air. Added on 2 L of oxygen. Now off of oxygen. Treating for pneumonia.  COPD with mild exacerbation. Continue with nebulizer treatment.  Hypertension: Continue with metoprolol. Hyperlipidemia: Continue with the statins. Hypothyroidism: Continue with Synthroid History of renal cell carcinoma status post nephrectomy. RN Pressure Injury Documentation:    Malnutrition Type:      Malnutrition Characteristics:      Nutrition Interventions:     Estimated body mass index is 36.23 kg/m as calculated from the following:   Height as of this encounter: 5' (1.524 m).   Weight as of this encounter: 84.1 kg.   DVT prophylaxis: Lovenox Code Status: Full  code Family Communication: Care discussed with patient Disposition Plan: If white count continued to improve, to discharge patient 1205.  Consultants:   None   Procedures:   None   Antimicrobials:   Ceftriaxone and azithromycin   Subjective: See report some improvement of cough, breathing better but not at baseline.  Objective: Vitals:   01/19/18 2048 01/20/18 0508 01/20/18 0810 01/20/18 1254  BP: 137/69 (!) 139/52  (!) 144/80  Pulse: 89 72  78  Resp: 20 18  20   Temp: 99.1 F (37.3 C) 98.4 F (36.9 C)  98.8 F (37.1 C)  TempSrc: Oral Oral  Oral  SpO2: 92% (!) 89% 90% 91%  Weight:      Height:        Intake/Output Summary (Last 24 hours) at 01/20/2018 1629 Last data filed at 01/20/2018 1300 Gross per 24 hour  Intake 768.76 ml  Output 1700 ml  Net -931.24 ml   Filed Weights   01/18/18 2011 01/19/18 0045  Weight: 74.8 kg 84.1 kg    Examination:  General exam: Appears calm and comfortable  Respiratory system: Bilateral rhonchus. Cardiovascular system: S1 & S2 heard, RRR. No JVD, murmurs, rubs, gallops or clicks. No pedal edema. Gastrointestinal system: Abdomen is nondistended, soft and nontender. No organomegaly or masses felt. Normal bowel sounds heard. Central nervous system: Alert and oriented. No focal neurological deficits. Extremities: Symmetric 5 x 5 power. Skin: No rashes, lesions or ulcers Psychiatry: Judgement and insight appear normal. Mood & affect appropriate.     Data Reviewed: I have personally reviewed following labs and imaging studies  CBC: Recent Labs  Lab 01/18/18 2015 01/19/18 0250 01/20/18 0540  WBC 18.2* 23.8* 19.9*  NEUTROABS 15.6*  20.0*  --   HGB 13.2 12.1 12.1  HCT 42.9 39.5 40.6  MCV 80.9 83.2 85.5  PLT 278 236 144   Basic Metabolic Panel: Recent Labs  Lab 01/18/18 2015 01/19/18 0250 01/20/18 0540  NA 137 140 137  K 4.4 3.8 4.0  CL 99 105 104  CO2 29 26 25   GLUCOSE 158* 123* 111*  BUN 18 19 10   CREATININE  1.03* 1.03* 0.71  CALCIUM 9.9 9.1 9.0   GFR: Estimated Creatinine Clearance: 62 mL/min (by C-G formula based on SCr of 0.71 mg/dL). Liver Function Tests: Recent Labs  Lab 01/18/18 2015 01/19/18 0250  AST 26 27  ALT 25 23  ALKPHOS 65 51  BILITOT 0.4 0.6  PROT 7.1 6.2*  ALBUMIN 3.8 3.5   No results for input(s): LIPASE, AMYLASE in the last 168 hours. No results for input(s): AMMONIA in the last 168 hours. Coagulation Profile: Recent Labs  Lab 01/18/18 2015  INR 0.97   Cardiac Enzymes: Recent Labs  Lab 01/19/18 0250 01/19/18 1757  TROPONINI 0.03* 0.03*   BNP (last 3 results) No results for input(s): PROBNP in the last 8760 hours. HbA1C: No results for input(s): HGBA1C in the last 72 hours. CBG: No results for input(s): GLUCAP in the last 168 hours. Lipid Profile: No results for input(s): CHOL, HDL, LDLCALC, TRIG, CHOLHDL, LDLDIRECT in the last 72 hours. Thyroid Function Tests: No results for input(s): TSH, T4TOTAL, FREET4, T3FREE, THYROIDAB in the last 72 hours. Anemia Panel: No results for input(s): VITAMINB12, FOLATE, FERRITIN, TIBC, IRON, RETICCTPCT in the last 72 hours. Sepsis Labs: Recent Labs  Lab 01/18/18 2045 01/19/18 0250 01/19/18 0547  PROCALCITON  --  0.20  --   LATICACIDVEN 2.00* 2.0* 1.8    Recent Results (from the past 240 hour(s))  Culture, blood (Routine x 2)     Status: None (Preliminary result)   Collection Time: 01/18/18  8:20 PM  Result Value Ref Range Status   Specimen Description   Final    BLOOD RIGHT ANTECUBITAL Performed at [facility], [address] [place], [place] [address]    Special Requests   Final    BOTTLES DRAWN AEROBIC AND ANAEROBIC Blood Culture adequate volume Performed at [facility], [address] [place], [place] [address]    Culture   Final    NO GROWTH 2 DAYS Performed at [facility], [place] [address], [place], [place] [address]    Report Status PENDING  Incomplete   Respiratory Panel by PCR     Status: None   Collection Time: 01/19/18  1:20 AM  Result Value Ref Range Status   Adenovirus NOT DETECTED NOT DETECTED Final   Coronavirus 229E NOT DETECTED NOT DETECTED Final   Coronavirus HKU1 NOT DETECTED NOT DETECTED Final   Coronavirus NL63 NOT DETECTED NOT DETECTED Final   Coronavirus OC43 NOT DETECTED NOT DETECTED Final   Metapneumovirus NOT DETECTED NOT DETECTED Final   Rhinovirus / Enterovirus NOT DETECTED NOT DETECTED Final   Influenza A NOT DETECTED NOT DETECTED Final   Influenza B NOT DETECTED NOT DETECTED Final   Parainfluenza Virus 1 NOT DETECTED NOT DETECTED Final   Parainfluenza Virus 2 NOT DETECTED NOT DETECTED Final   Parainfluenza Virus 3 NOT DETECTED NOT DETECTED Final   Parainfluenza Virus 4 NOT DETECTED NOT DETECTED Final   Respiratory Syncytial Virus NOT DETECTED NOT DETECTED Final   Bordetella pertussis NOT DETECTED NOT DETECTED Final   Chlamydophila pneumoniae  NOT DETECTED NOT DETECTED Final   Mycoplasma pneumoniae NOT DETECTED NOT DETECTED Final    Comment: Performed at [facility], [place] [address] [place], [place] [address]  Culture, Urine     Status: Abnormal   Collection Time: 01/19/18 10:23 AM  Result Value Ref Range Status   Specimen Description   Final    URINE, CLEAN CATCH Performed at [facility], [place] [address] [place], [place] [address]    Special Requests   Final    NONE Performed at [facility], [place] [address] [place], [place] [address]    Culture (A)  Final    <10,000 COLONIES/mL INSIGNIFICANT GROWTH Performed at [facility] [address] [place], [place] [address]    Report Status 01/20/2018 FINAL  Final         Radiology Studies: Dg Chest 2 View  Result Date: 01/20/2018 CLINICAL DATA:  Productive cough with back pain EXAM: CHEST - 2 VIEW COMPARISON:  None. FINDINGS: Left upper lobe airspace disease following the major fissure, best seen on  the lateral view. Normal heart size and mediastinal contours. Implantable loop recorder. IMPRESSION: Left upper lobe pneumonia. Followup PA and lateral chest X-ray is recommended in 3-4 weeks following trial of antibiotic therapy to ensure resolution. Electronically Signed   By: Monte Fantasia M.D.   On: 01/20/2018 11:46   Dg Chest 2 View  Result Date: 01/18/2018 CLINICAL DATA:  Fever and chills with dyspnea since 4 p.m. EXAM: CHEST - 2 VIEW COMPARISON:  None. FINDINGS: Top-normal heart size with mild-to-moderate aortic atherosclerosis. Loop recording device projects over the left anterior chest wall. Slight hyperinflation of the lungs, upper lobe predominant with crowding of lower lobe interstitial lung markings. No alveolar consolidation, effusion or pneumothorax. 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 Aortic atherosclerosis. No active pulmonary disease. Electronically Signed   By: Ashley Royalty M.D.   On: 01/18/2018 21:27        Scheduled Meds: . aspirin EC  81 mg Oral Daily  . [START ON 01/21/2018] azithromycin  500 mg Oral Daily  . calcium-vitamin D  2 tablet Oral Daily  . levalbuterol  0.63 mg Nebulization TID  . levothyroxine  88 mcg Oral Q0600  . mouth rinse  15 mL Mouth Rinse BID  . metoprolol succinate  25 mg Oral QHS  . omega-3 acid ethyl esters  1,000 mg Oral Daily  . pantoprazole  40 mg Oral Daily  . saccharomyces boulardii  250 mg Oral BID  . simvastatin  20 mg Oral q1800  . umeclidinium-vilanterol  1 puff Inhalation Daily   Continuous Infusions: . cefTRIAXone (ROCEPHIN)  IV Stopped (01/19/18 1759)     LOS: 1 day    Time spent: 35 minutes.     Elmarie Shiley, MD [facility] Pager [phone]  If 7PM-7AM, please contact night-coverage [email or web address] Password [email or web address] 01/20/2018, 4:29 PM

## 2018-01-20 NOTE — Progress Notes (Signed)
PHARMACIST - PHYSICIAN COMMUNICATION  CONCERNING: Antibiotic IV to Oral Route Change Policy  RECOMMENDATION: This patient is receiving azithromycin by the intravenous route.  Based on criteria approved by the Pharmacy and Therapeutics Committee, the antibiotic(s) is/are being converted to the equivalent oral dose form(s).   DESCRIPTION: These criteria include:  Patient being treated for a respiratory tract infection, urinary tract infection, cellulitis or clostridium difficile associated diarrhea if on metronidazole  The patient is not neutropenic and does not exhibit a GI malabsorption state  The patient is eating (either orally or via tube) and/or has been taking other orally administered medications for a least 24 hours  The patient is improving clinically and has a Tmax < 100.5  If you have questions about this conversion, please contact the Pharmacy Department  []   [phone] )  [facility] []   [phone] )  [facility] []   [phone] )  [facility] []   [phone] )  [facility] [x]   [phone] )  [place], [place].D [phone] 01/20/2018 9:04 AM

## 2018-01-21 LAB — CBC
HCT: 37.9 % (ref 36.0–46.0)
Hemoglobin: 11.4 g/dL — ABNORMAL LOW (ref 12.0–15.0)
MCH: 24.6 pg — ABNORMAL LOW (ref 26.0–34.0)
MCHC: 30.1 g/dL (ref 30.0–36.0)
MCV: 81.7 fL (ref 80.0–100.0)
Platelets: 252 10*3/uL (ref 150–400)
RBC: 4.64 MIL/uL (ref 3.87–5.11)
RDW: 14.5 % (ref 11.5–15.5)
WBC: 11.2 10*3/uL — ABNORMAL HIGH (ref 4.0–10.5)
nRBC: 0 % (ref 0.0–0.2)

## 2018-01-21 MED ORDER — SACCHAROMYCES BOULARDII 250 MG PO CAPS: 250.0000 mg | ORAL_CAPSULE | Freq: Two times a day (BID) | ORAL | 0 refills | Status: AC

## 2018-01-21 MED ORDER — ALBUTEROL SULFATE HFA 108 (90 BASE) MCG/ACT IN AERS: 2.0000 | INHALATION_SPRAY | Freq: Four times a day (QID) | RESPIRATORY_TRACT | 2 refills | Status: AC | PRN

## 2018-01-21 MED ORDER — AZITHROMYCIN 500 MG PO TABS: 500.0000 mg | ORAL_TABLET | Freq: Every day | ORAL | 0 refills | Status: AC

## 2018-01-21 MED ORDER — AMOXICILLIN-POT CLAVULANATE 875-125 MG PO TABS: 1.0000 | ORAL_TABLET | Freq: Two times a day (BID) | ORAL | 0 refills | Status: AC

## 2018-01-21 NOTE — Discharge Summary (Signed)
Physician Discharge Summary  Rhonda Hancock FGH:[id number] DOB: 03/30/1945 DOA: 01/18/2018  PCP: System, Pcp Not In  Admit date: 01/18/2018 Discharge date: 01/21/2018  Admitted From:  Home  Disposition: Home   Recommendations for Outpatient Follow-up:  1. Follow up with PCP in 1-2 weeks 2. Please obtain BMP/CBC in one week 3. Needs follow up chest x ray to document resolution.   Home Health: none  Discharge Condition: stable.  CODE STATUS; full code.  Diet recommendation: Heart Healthy  Brief/Interim Summary: Brief Narrative: 72 year old with past medical history significant for  renal cell carcinoma, status post nephrectomy, COPD, who is visiting [place] from [place], she developed sudden onset of fevers chills.  She noticed some mild productive cough. She was found to have a temperature of 103, significant leukocytosis.  Initial chest x-ray was unremarkable.  Influenza PCR  for negative.    Assessment & Plan:   Principal Problem:  Sepsis  Left upper lobe PNA Active Problems:   COPD with acute exacerbation (HCC)   Essential hypertension   Hypothyroidism   HLD (hyperlipidemia)   1-Left upper lobe pneumonia; Patient presented with cough, fever initial chest x-ray was negative.  Repeated chest x-ray showed left upper lobe pneumonia. Patient with significant leukocytosis.    She was treated with IV ceftriaxone and azithromycin for 2 days.  She will be discharge on Augmentin and azithromycin for 5 more days.  She is stable for discharge.  Needs follow up chest ray to document resolution of PNA>  Respiratory panel negative.  WBC has decreased to 11 form up to 23 during this admission.   2-Sepsis; patient present with leukocytosis, mild increased creatinine, fever, tachycardia, hypoxemia. Repeated chest x-ray confirmed pneumonia. Received IV antibiotics, IV fluids. Resolved.   3 Acute hypoxic respiratory failure. Oxygen saturation 86 on room air on  admission Added on 2 L of oxygen. Now off of oxygen. Treating for pneumonia. Resolved.   COPD with mild exacerbation. Continue with nebulizer treatment. Improved.   Hypertension: Continue with metoprolol.  Hyperlipidemia: Continue with the statins.  Hypothyroidism: Continue with Synthroid  History of renal cell carcinoma status post nephrectomy.    Discharge Diagnoses:  Principal Problem:   SIRS (systemic inflammatory response syndrome) (HCC) Active Problems:   COPD with acute exacerbation (HCC)   Essential hypertension   Hypothyroidism   HLD (hyperlipidemia)    Discharge Instructions  Discharge Instructions    Diet - low sodium heart healthy   Complete by:  As directed    Increase activity slowly   Complete by:  As directed      Allergies as of 01/21/2018      Reactions   Vibramycin [doxycycline Calcium]    Hives, rash   Meperidine Nausea And Vomiting      Medication List    STOP taking these medications   ibuprofen 200 MG tablet Commonly known as:  ADVIL,MOTRIN     TAKE these medications   albuterol 108 (90 Base) MCG/ACT inhaler Commonly known as:  PROVENTIL HFA;VENTOLIN HFA Inhale 2 puffs into the lungs every 6 (six) hours as needed for wheezing or shortness of breath.   amoxicillin-clavulanate 875-125 MG tablet Commonly known as:  AUGMENTIN Take 1 tablet by mouth 2 (two) times daily for 7 days.   ANORO ELLIPTA 62.5-25 MCG/INH Aepb Generic drug:  umeclidinium-vilanterol Inhale 1 puff into the lungs daily.   aspirin EC 81 MG tablet Take 81 mg by mouth daily.   azithromycin 500 MG tablet Commonly known as:  ZITHROMAX Take  1 tablet (500 mg total) by mouth daily.   calcium-vitamin D 250-125 MG-UNIT tablet Commonly known as:  OSCAL WITH D Take 2 tablets by mouth daily.   Fish Oil 1000 MG Caps Take 1,000 mg by mouth daily.   levothyroxine 88 MCG tablet Commonly known as:  SYNTHROID, LEVOTHROID Take 88 mcg by mouth daily before  breakfast.   metoprolol succinate 25 MG 24 hr tablet Commonly known as:  TOPROL-XL Take 25 mg by mouth at bedtime.   omeprazole 40 MG capsule Commonly known as:  PRILOSEC Take 40 mg by mouth every other day.   saccharomyces boulardii 250 MG capsule Commonly known as:  FLORASTOR Take 1 capsule (250 mg total) by mouth 2 (two) times daily.   simvastatin 20 MG tablet Commonly known as:  ZOCOR Take 20 mg by mouth at bedtime.   tiZANidine 4 MG tablet Commonly known as:  ZANAFLEX Take 4 mg by mouth at bedtime as needed for muscle spasms.       Allergies  Allergen Reactions  . Vibramycin [Doxycycline Calcium]     Hives, rash  . Meperidine Nausea And Vomiting    Consultations:  none   Procedures/Studies: Dg Chest 2 View  Result Date: 01/20/2018 CLINICAL DATA:  Productive cough with back pain EXAM: CHEST - 2 VIEW COMPARISON:  None. FINDINGS: Left upper lobe airspace disease following the major fissure, best seen on the lateral view. Normal heart size and mediastinal contours. Implantable loop recorder. IMPRESSION: Left upper lobe pneumonia. Followup PA and lateral chest X-ray is recommended in 3-4 weeks following trial of antibiotic therapy to ensure resolution. Electronically Signed   By: Monte Fantasia M.D.   On: 01/20/2018 11:46   Dg Chest 2 View  Result Date: 01/18/2018 CLINICAL DATA:  Fever and chills with dyspnea since 4 p.m. EXAM: CHEST - 2 VIEW COMPARISON:  None. FINDINGS: Top-normal heart size with mild-to-moderate aortic atherosclerosis. Loop recording device projects over the left anterior chest wall. Slight hyperinflation of the lungs, upper lobe predominant with crowding of lower lobe interstitial lung markings. No alveolar consolidation, effusion or pneumothorax. 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 Aortic atherosclerosis. No active pulmonary disease. Electronically  Signed   By: Ashley Royalty M.D.   On: 01/18/2018 21:27     Subjective: She is breathing better. She is now coughing up more. She is feeling well to go home.   Discharge Exam: Vitals:   01/21/18 0600 01/21/18 0859  BP: (!) 150/68   Pulse: 74   Resp: 18   Temp: 98.6 F (37 C)   SpO2: 94% 92%   Vitals:   01/20/18 2043 01/20/18 2050 01/21/18 0600 01/21/18 0859  BP: (!) 147/66  (!) 150/68   Pulse: 81  74   Resp: 16  18   Temp: 98.7 F (37.1 C)  98.6 F (37 C)   TempSrc: Oral  Oral   SpO2: 96% 92% 94% 92%  Weight:      Height:        General: Pt is alert, awake, not in acute distress Cardiovascular: RRR, S1/S2 +, no rubs, no gallops Respiratory: CTA bilaterally, no wheezing, bilateral rhonchi Abdominal: Soft, NT, ND, bowel sounds + Extremities: no edema, no cyanosis    The results of significant diagnostics from this hospitalization (including imaging, microbiology, ancillary and laboratory) are listed below for reference.     Microbiology: Recent Results (from the past 240 hour(s))  Culture, blood (Routine  x 2)     Status: None (Preliminary result)   Collection Time: 01/18/18  8:20 PM  Result Value Ref Range Status   Specimen Description   Final    BLOOD RIGHT ANTECUBITAL Performed at [facility], [address] [place], [place] [address]    Special Requests   Final    BOTTLES DRAWN AEROBIC AND ANAEROBIC Blood Culture adequate volume Performed at [facility], [address] [place], [place] [address]    Culture   Final    NO GROWTH 3 DAYS Performed at [facility], [place] [address], [place], [place] [address]    Report Status PENDING  Incomplete  Respiratory Panel by PCR     Status: None   Collection Time: 01/19/18  1:20 AM  Result Value Ref Range Status   Adenovirus NOT DETECTED NOT DETECTED Final   Coronavirus 229E NOT DETECTED NOT DETECTED Final   Coronavirus HKU1 NOT DETECTED NOT DETECTED Final   Coronavirus NL63 NOT  DETECTED NOT DETECTED Final   Coronavirus OC43 NOT DETECTED NOT DETECTED Final   Metapneumovirus NOT DETECTED NOT DETECTED Final   Rhinovirus / Enterovirus NOT DETECTED NOT DETECTED Final   Influenza A NOT DETECTED NOT DETECTED Final   Influenza B NOT DETECTED NOT DETECTED Final   Parainfluenza Virus 1 NOT DETECTED NOT DETECTED Final   Parainfluenza Virus 2 NOT DETECTED NOT DETECTED Final   Parainfluenza Virus 3 NOT DETECTED NOT DETECTED Final   Parainfluenza Virus 4 NOT DETECTED NOT DETECTED Final   Respiratory Syncytial Virus NOT DETECTED NOT DETECTED Final   Bordetella pertussis NOT DETECTED NOT DETECTED Final   Chlamydophila pneumoniae NOT DETECTED NOT DETECTED Final   Mycoplasma pneumoniae NOT DETECTED NOT DETECTED Final    Comment: Performed at [facility], [place] [address], [place], [place] [address]  Culture, Urine     Status: Abnormal   Collection Time: 01/19/18 10:23 AM  Result Value Ref Range Status   Specimen Description   Final    URINE, CLEAN CATCH Performed at [facility], [place] [address] [place], [place] [address]    Special Requests   Final    NONE Performed at [facility], [place] [address], [place], [place] [address]    Culture (A)  Final    <10,000 COLONIES/mL INSIGNIFICANT GROWTH Performed at [place] [address], [place], [place] [address]    Report Status 01/20/2018 FINAL  Final     Labs: BNP (last 3 results) No results for input(s): BNP in the last 8760 hours. Basic Metabolic Panel: Recent Labs  Lab 01/18/18 2015 01/19/18 0250 01/20/18 0540  NA 137 140 137  K 4.4 3.8 4.0  CL 99 105 104  CO2 29 26 25   GLUCOSE 158* 123* 111*  BUN 18 19 10   CREATININE 1.03* 1.03* 0.71  CALCIUM 9.9 9.1 9.0   Liver Function Tests: Recent Labs  Lab 01/18/18 2015 01/19/18 0250  AST 26 27  ALT 25 23  ALKPHOS 65 51  BILITOT 0.4 0.6  PROT 7.1 6.2*  ALBUMIN 3.8 3.5   No results for input(s): LIPASE,  AMYLASE in the last 168 hours. No results for input(s): AMMONIA in the last 168 hours. CBC: Recent Labs  Lab 01/18/18 2015 01/19/18 0250 01/20/18 0540 01/21/18 0613  WBC 18.2* 23.8* 19.9* 11.2*  NEUTROABS 15.6* 20.0*  --   --   HGB 13.2 12.1 12.1 11.4*  HCT 42.9  39.5 40.6 37.9  MCV 80.9 83.2 85.5 81.7  PLT 278 236 254 252   Cardiac Enzymes: Recent Labs  Lab 01/19/18 0250 01/19/18 1757  TROPONINI 0.03* 0.03*   BNP: Invalid input(s): POCBNP CBG: No results for input(s): GLUCAP in the last 168 hours. D-Dimer No results for input(s): DDIMER in the last 72 hours. Hgb A1c No results for input(s): HGBA1C in the last 72 hours. Lipid Profile No results for input(s): CHOL, HDL, LDLCALC, TRIG, CHOLHDL, LDLDIRECT in the last 72 hours. Thyroid function studies No results for input(s): TSH, T4TOTAL, T3FREE, THYROIDAB in the last 72 hours.  Invalid input(s): FREET3 Anemia work up No results for input(s): VITAMINB12, FOLATE, FERRITIN, TIBC, IRON, RETICCTPCT in the last 72 hours. Urinalysis    Component Value Date/Time   COLORURINE YELLOW 01/18/2018 2124   APPEARANCEUR CLEAR 01/18/2018 2124   LABSPEC 1.020 01/18/2018 2124   PHURINE 8.0 01/18/2018 2124   GLUCOSEU NEGATIVE 01/18/2018 2124   HGBUR TRACE (A) 01/18/2018 2124   BILIRUBINUR NEGATIVE 01/18/2018 2124   KETONESUR NEGATIVE 01/18/2018 2124   PROTEINUR NEGATIVE 01/18/2018 2124   NITRITE NEGATIVE 01/18/2018 2124   LEUKOCYTESUR NEGATIVE 01/18/2018 2124   Sepsis Labs Invalid input(s): PROCALCITONIN,  WBC,  LACTICIDVEN Microbiology Recent Results (from the past 240 hour(s))  Culture, blood (Routine x 2)     Status: None (Preliminary result)   Collection Time: 01/18/18  8:20 PM  Result Value Ref Range Status   Specimen Description   Final    BLOOD RIGHT ANTECUBITAL Performed at [facility], [address] [place], [place] [address]    Special Requests   Final    BOTTLES DRAWN AEROBIC AND ANAEROBIC Blood  Culture adequate volume Performed at [facility], [address] [place], [place] [address]    Culture   Final    NO GROWTH 3 DAYS Performed at [facility], [place] [address], [place], [place] [address]    Report Status PENDING  Incomplete  Respiratory Panel by PCR     Status: None   Collection Time: 01/19/18  1:20 AM  Result Value Ref Range Status   Adenovirus NOT DETECTED NOT DETECTED Final   Coronavirus 229E NOT DETECTED NOT DETECTED Final   Coronavirus HKU1 NOT DETECTED NOT DETECTED Final   Coronavirus NL63 NOT DETECTED NOT DETECTED Final   Coronavirus OC43 NOT DETECTED NOT DETECTED Final   Metapneumovirus NOT DETECTED NOT DETECTED Final   Rhinovirus / Enterovirus NOT DETECTED NOT DETECTED Final   Influenza A NOT DETECTED NOT DETECTED Final   Influenza B NOT DETECTED NOT DETECTED Final   Parainfluenza Virus 1 NOT DETECTED NOT DETECTED Final   Parainfluenza Virus 2 NOT DETECTED NOT DETECTED Final   Parainfluenza Virus 3 NOT DETECTED NOT DETECTED Final   Parainfluenza Virus 4 NOT DETECTED NOT DETECTED Final   Respiratory Syncytial Virus NOT DETECTED NOT DETECTED Final   Bordetella pertussis NOT DETECTED NOT DETECTED Final   Chlamydophila pneumoniae NOT DETECTED NOT DETECTED Final   Mycoplasma pneumoniae NOT DETECTED NOT DETECTED Final    Comment: Performed at [facility], [place] [address], [place], [place] [address]  Culture, Urine     Status: Abnormal   Collection Time: 01/19/18 10:23 AM  Result Value Ref Range Status   Specimen Description   Final    URINE, CLEAN CATCH Performed at [facility], [place] [address] [place], [place] [address]    Special Requests   Final  NONE Performed at [facility], [place] [address], [place], [place] [address]    Culture (A)  Final    <10,000 COLONIES/mL INSIGNIFICANT GROWTH Performed at [place] [address], [place],  [address]    Report Status 01/20/2018  FINAL  Final     Time coordinating discharge: 35 minutes   SIGNED:   Elmarie Shiley, MD  [facility] 01/21/2018, 7:05 PM Pager   If 7PM-7AM, please contact night-coverage [email or web address] Password [email or web address]

## 2018-01-23 LAB — CULTURE, BLOOD (ROUTINE X 2)
Culture: NO GROWTH
Special Requests: ADEQUATE

## 2019-08-31 IMAGING — DX DG CHEST 2V
2 series · 2 of 2 positions shown · non-contrast
Comparison: None.

CLINICAL DATA: Productive cough with back pain

EXAM:
CHEST - 2 VIEW

[chest pa]
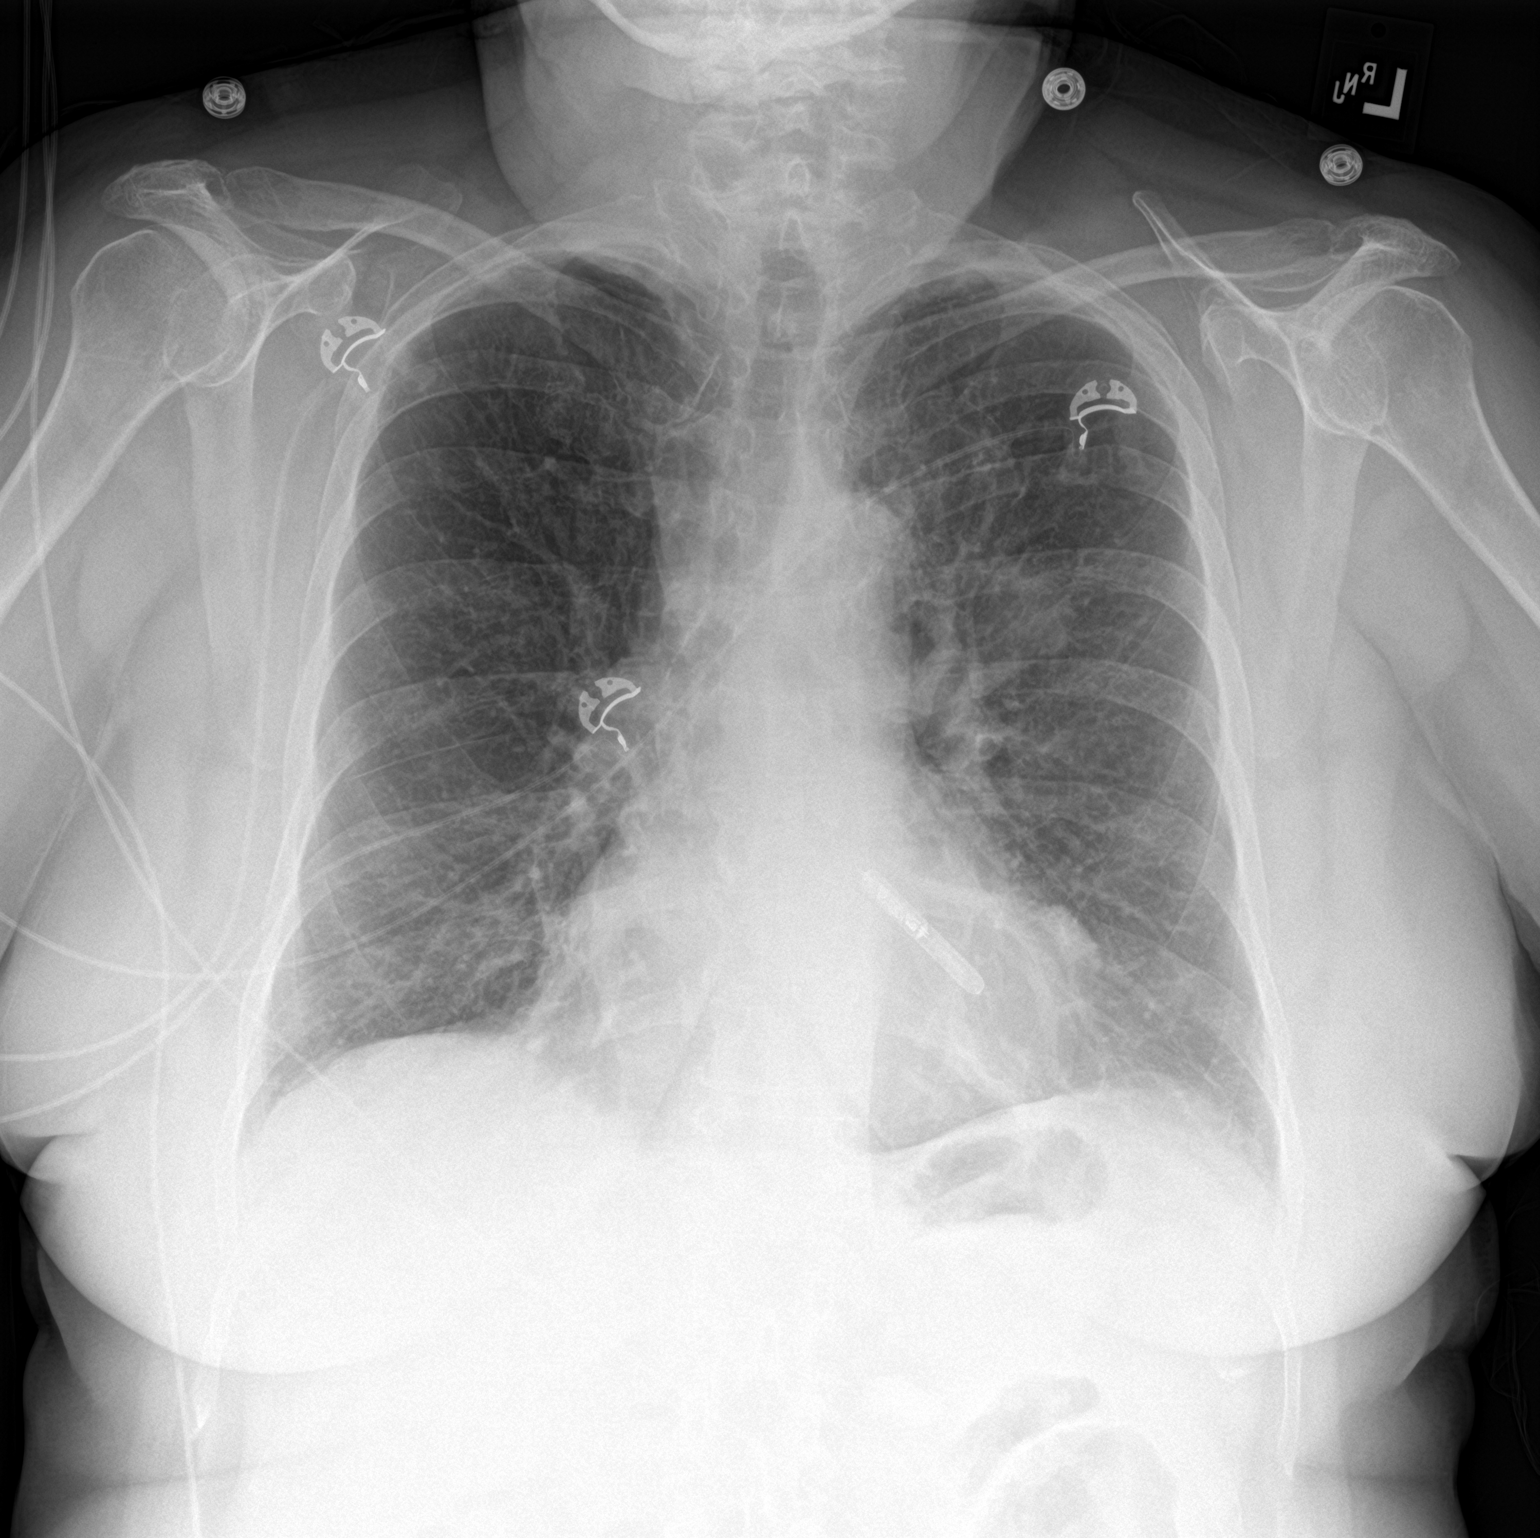

[chest lat]
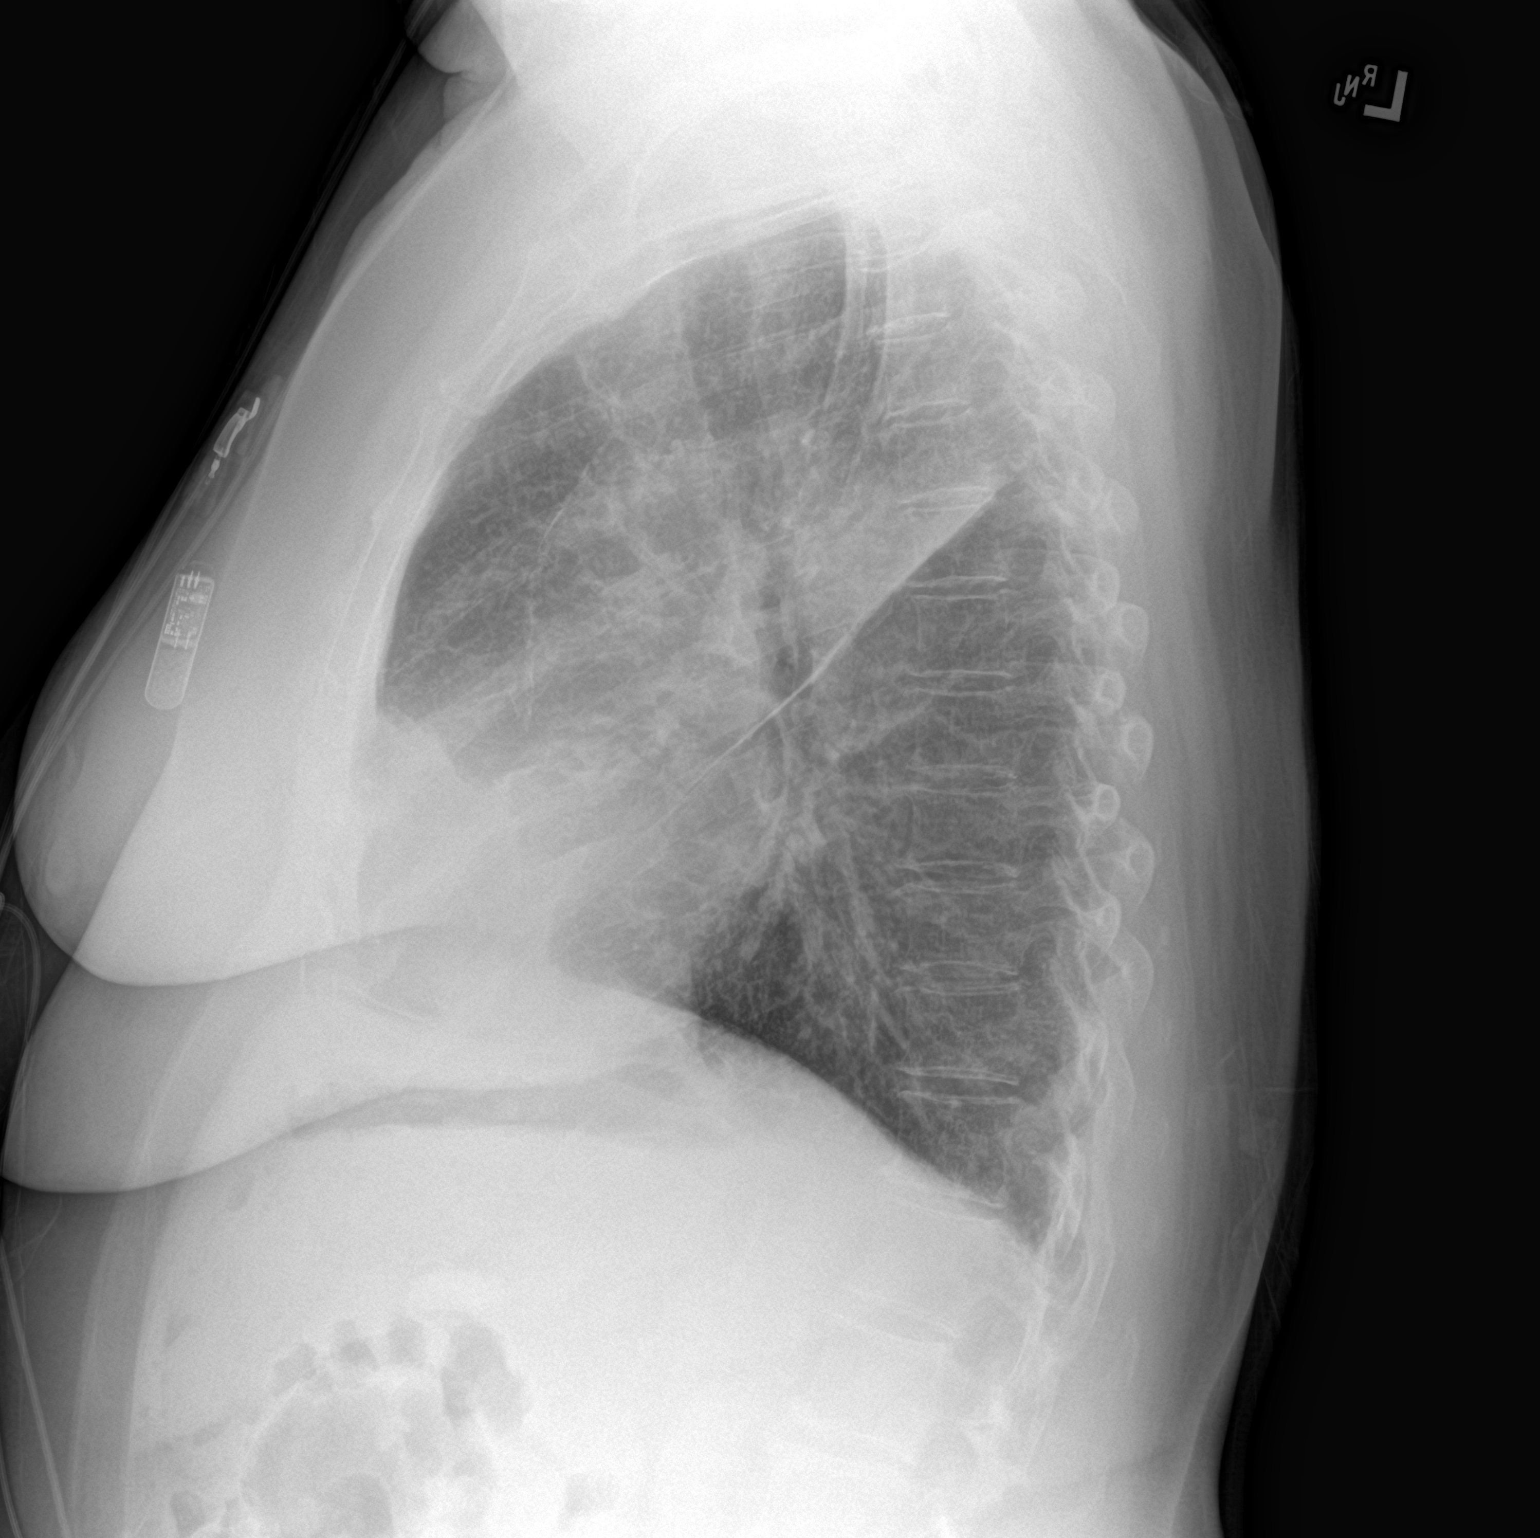

[2 of 2 positions shown; findings below may reference images not displayed]

FINDINGS: Left upper lobe airspace disease following the major fissure, best
seen on the lateral view.

Normal heart size and mediastinal contours. Implantable loop
recorder.
IMPRESSION: Left upper lobe pneumonia. Followup PA and lateral chest X-ray is
recommended in 3-4 weeks following trial of antibiotic therapy to
ensure resolution.
